# Patient Record
Sex: Female | Born: 1940 | Race: White | Hispanic: No | Marital: Married
Health system: Southern US, Community
[De-identification: ages and names within clinical notes are randomized; demographics above are authoritative.]

## PROBLEM LIST (undated history)

## (undated) DIAGNOSIS — J449 Chronic obstructive pulmonary disease, unspecified: Secondary | ICD-10-CM

## (undated) DIAGNOSIS — I10 Essential (primary) hypertension: Secondary | ICD-10-CM

## (undated) DIAGNOSIS — I639 Cerebral infarction, unspecified: Secondary | ICD-10-CM

## (undated) DIAGNOSIS — S72009A Fracture of unspecified part of neck of unspecified femur, initial encounter for closed fracture: Secondary | ICD-10-CM

## (undated) DIAGNOSIS — I712 Thoracic aortic aneurysm, without rupture, unspecified: Secondary | ICD-10-CM

## (undated) DIAGNOSIS — C50919 Malignant neoplasm of unspecified site of unspecified female breast: Secondary | ICD-10-CM

## (undated) DIAGNOSIS — N182 Chronic kidney disease, stage 2 (mild): Secondary | ICD-10-CM

---

## 2015-03-07 ENCOUNTER — Other Ambulatory Visit (HOSPITAL_COMMUNITY): Payer: Self-pay

## 2015-03-07 ENCOUNTER — Inpatient Hospital Stay
Admission: AD | Admit: 2015-03-07 | Discharge: 2015-04-05 | Disposition: A | Discharge status: Skilled Nursing Facility | Payer: Medicare Other | Source: Ambulatory Visit | Attending: Internal Medicine | Admitting: Internal Medicine

## 2015-03-07 DIAGNOSIS — Z9911 Dependence on respirator [ventilator] status: Secondary | ICD-10-CM

## 2015-03-07 DIAGNOSIS — Z930 Tracheostomy status: Secondary | ICD-10-CM

## 2015-03-07 DIAGNOSIS — J439 Emphysema, unspecified: Secondary | ICD-10-CM

## 2015-03-07 DIAGNOSIS — J9690 Respiratory failure, unspecified, unspecified whether with hypoxia or hypercapnia: Secondary | ICD-10-CM

## 2015-03-07 DIAGNOSIS — Z431 Encounter for attention to gastrostomy: Secondary | ICD-10-CM

## 2015-03-07 DIAGNOSIS — J9621 Acute and chronic respiratory failure with hypoxia: Secondary | ICD-10-CM

## 2015-03-07 HISTORY — DX: Thoracic aortic aneurysm, without rupture, unspecified: I71.20

## 2015-03-07 HISTORY — DX: Thoracic aortic aneurysm, without rupture: I71.2

## 2015-03-07 HISTORY — DX: Essential (primary) hypertension: I10

## 2015-03-07 HISTORY — DX: Malignant neoplasm of unspecified site of unspecified female breast: C50.919

## 2015-03-07 HISTORY — DX: Chronic obstructive pulmonary disease, unspecified: J44.9

## 2015-03-07 HISTORY — DX: Chronic kidney disease, stage 2 (mild): N18.2

## 2015-03-07 HISTORY — DX: Cerebral infarction, unspecified: I63.9

## 2015-03-07 HISTORY — DX: Fracture of unspecified part of neck of unspecified femur, initial encounter for closed fracture: S72.009A

## 2015-03-08 LAB — CBC WITH DIFFERENTIAL/PLATELET
Basophils Absolute: 0 10*3/uL (ref 0.0–0.1)
Basophils Relative: 0 %
EOS ABS: 0.1 10*3/uL (ref 0.0–0.7)
EOS PCT: 2 %
HCT: 29.9 % — ABNORMAL LOW (ref 36.0–46.0)
HEMOGLOBIN: 9.1 g/dL — AB (ref 12.0–15.0)
LYMPHS ABS: 1.2 10*3/uL (ref 0.7–4.0)
Lymphocytes Relative: 16 %
MCH: 27.7 pg (ref 26.0–34.0)
MCHC: 30.4 g/dL (ref 30.0–36.0)
MCV: 91.2 fL (ref 78.0–100.0)
MONOS PCT: 5 %
Monocytes Absolute: 0.4 10*3/uL (ref 0.1–1.0)
NEUTROS PCT: 77 %
Neutro Abs: 5.9 10*3/uL (ref 1.7–7.7)
Platelets: 227 10*3/uL (ref 150–400)
RBC: 3.28 MIL/uL — ABNORMAL LOW (ref 3.87–5.11)
RDW: 15.8 % — ABNORMAL HIGH (ref 11.5–15.5)
WBC: 7.6 10*3/uL (ref 4.0–10.5)

## 2015-03-08 LAB — PHOSPHORUS: PHOSPHORUS: 4.4 mg/dL (ref 2.5–4.6)

## 2015-03-08 LAB — COMPREHENSIVE METABOLIC PANEL
ALK PHOS: 64 U/L (ref 38–126)
ALT: 29 U/L (ref 14–54)
ANION GAP: 10 (ref 5–15)
AST: 26 U/L (ref 15–41)
Albumin: 2.4 g/dL — ABNORMAL LOW (ref 3.5–5.0)
BUN: 28 mg/dL — ABNORMAL HIGH (ref 6–20)
CALCIUM: 9.7 mg/dL (ref 8.9–10.3)
CO2: 25 mmol/L (ref 22–32)
Chloride: 105 mmol/L (ref 101–111)
Creatinine, Ser: 0.39 mg/dL — ABNORMAL LOW (ref 0.44–1.00)
Glucose, Bld: 100 mg/dL — ABNORMAL HIGH (ref 65–99)
Potassium: 3.8 mmol/L (ref 3.5–5.1)
SODIUM: 140 mmol/L (ref 135–145)
TOTAL PROTEIN: 5.9 g/dL — AB (ref 6.5–8.1)
Total Bilirubin: 0.5 mg/dL (ref 0.3–1.2)

## 2015-03-08 LAB — BLOOD GAS, ARTERIAL
Acid-Base Excess: 3.8 mmol/L — ABNORMAL HIGH (ref 0.0–2.0)
Bicarbonate: 27 mEq/L — ABNORMAL HIGH (ref 20.0–24.0)
FIO2: 0.3
MECHVT: 450 mL
O2 Saturation: 97.7 %
PATIENT TEMPERATURE: 98.2
PCO2 ART: 34.8 mmHg — AB (ref 35.0–45.0)
PEEP: 5 cmH2O
PO2 ART: 92.5 mmHg (ref 80.0–100.0)
RATE: 14 resp/min
TCO2: 28.1 mmol/L (ref 0–100)
pH, Arterial: 7.501 — ABNORMAL HIGH (ref 7.350–7.450)

## 2015-03-08 LAB — MAGNESIUM: MAGNESIUM: 2.2 mg/dL (ref 1.7–2.4)

## 2015-03-08 LAB — TSH: TSH: 3.368 u[IU]/mL (ref 0.350–4.500)

## 2015-03-13 LAB — BASIC METABOLIC PANEL
Anion gap: 9 (ref 5–15)
BUN: 34 mg/dL — AB (ref 6–20)
CHLORIDE: 108 mmol/L (ref 101–111)
CO2: 31 mmol/L (ref 22–32)
CREATININE: 0.5 mg/dL (ref 0.44–1.00)
Calcium: 9.3 mg/dL (ref 8.9–10.3)
GFR calc Af Amer: >60 mL/min (ref >60)
GFR calc non Af Amer: >60 mL/min (ref >60)
GLUCOSE: 145 mg/dL — AB (ref 65–99)
Potassium: 3.5 mmol/L (ref 3.5–5.1)
SODIUM: 148 mmol/L — AB (ref 135–145)

## 2015-03-13 LAB — CBC
HCT: 32.6 % — ABNORMAL LOW (ref 36.0–46.0)
HEMOGLOBIN: 9.4 g/dL — AB (ref 12.0–15.0)
MCH: 27 pg (ref 26.0–34.0)
MCHC: 28.8 g/dL — AB (ref 30.0–36.0)
MCV: 93.7 fL (ref 78.0–100.0)
Platelets: 245 10*3/uL (ref 150–400)
RBC: 3.48 MIL/uL — ABNORMAL LOW (ref 3.87–5.11)
RDW: 16.4 % — ABNORMAL HIGH (ref 11.5–15.5)
WBC: 10.2 10*3/uL (ref 4.0–10.5)

## 2015-03-16 LAB — URINALYSIS, ROUTINE W REFLEX MICROSCOPIC
BILIRUBIN URINE: NEGATIVE
Glucose, UA: NEGATIVE mg/dL
HGB URINE DIPSTICK: NEGATIVE
Ketones, ur: NEGATIVE mg/dL
NITRITE: NEGATIVE
PH: 5.5 (ref 5.0–8.0)
Protein, ur: NEGATIVE mg/dL
SPECIFIC GRAVITY, URINE: 1.022 (ref 1.005–1.030)

## 2015-03-16 LAB — URINE MICROSCOPIC-ADD ON

## 2015-03-16 LAB — POTASSIUM: Potassium: 3.6 mmol/L (ref 3.5–5.1)

## 2015-03-17 ENCOUNTER — Other Ambulatory Visit (HOSPITAL_COMMUNITY): Payer: Self-pay

## 2015-03-17 LAB — BASIC METABOLIC PANEL
ANION GAP: 9 (ref 5–15)
BUN: 43 mg/dL — AB (ref 6–20)
CHLORIDE: 115 mmol/L — AB (ref 101–111)
CO2: 34 mmol/L — ABNORMAL HIGH (ref 22–32)
Calcium: 9.2 mg/dL (ref 8.9–10.3)
Creatinine, Ser: 0.58 mg/dL (ref 0.44–1.00)
GFR calc Af Amer: >60 mL/min (ref >60)
GFR calc non Af Amer: >60 mL/min (ref >60)
Glucose, Bld: 138 mg/dL — ABNORMAL HIGH (ref 65–99)
POTASSIUM: 3.4 mmol/L — AB (ref 3.5–5.1)
SODIUM: 158 mmol/L — AB (ref 135–145)

## 2015-03-17 LAB — CBC
HCT: 31.7 % — ABNORMAL LOW (ref 36.0–46.0)
HEMOGLOBIN: 8.8 g/dL — AB (ref 12.0–15.0)
MCH: 27.2 pg (ref 26.0–34.0)
MCHC: 27.8 g/dL — ABNORMAL LOW (ref 30.0–36.0)
MCV: 98.1 fL (ref 78.0–100.0)
Platelets: 197 10*3/uL (ref 150–400)
RBC: 3.23 MIL/uL — AB (ref 3.87–5.11)
RDW: 16.7 % — ABNORMAL HIGH (ref 11.5–15.5)
WBC: 11.4 10*3/uL — AB (ref 4.0–10.5)

## 2015-03-17 LAB — CULTURE, BLOOD (ROUTINE X 2)
CULTURE: NO GROWTH
CULTURE: NO GROWTH

## 2015-03-17 LAB — URINE CULTURE

## 2015-03-18 LAB — BASIC METABOLIC PANEL
Anion gap: 12 (ref 5–15)
BUN: 40 mg/dL — AB (ref 6–20)
CHLORIDE: 114 mmol/L — AB (ref 101–111)
CO2: 30 mmol/L (ref 22–32)
CREATININE: 0.54 mg/dL (ref 0.44–1.00)
Calcium: 9.3 mg/dL (ref 8.9–10.3)
GFR calc Af Amer: >60 mL/min (ref >60)
GFR calc non Af Amer: >60 mL/min (ref >60)
GLUCOSE: 134 mg/dL — AB (ref 65–99)
POTASSIUM: 3.7 mmol/L (ref 3.5–5.1)
SODIUM: 156 mmol/L — AB (ref 135–145)

## 2015-03-21 LAB — BASIC METABOLIC PANEL
Anion gap: 16 — ABNORMAL HIGH (ref 5–15)
BUN: 38 mg/dL — AB (ref 6–20)
CALCIUM: 9.2 mg/dL (ref 8.9–10.3)
CO2: 28 mmol/L (ref 22–32)
CREATININE: 0.59 mg/dL (ref 0.44–1.00)
Chloride: 109 mmol/L (ref 101–111)
GFR calc non Af Amer: >60 mL/min (ref >60)
Glucose, Bld: 133 mg/dL — ABNORMAL HIGH (ref 65–99)
Potassium: 5.8 mmol/L — ABNORMAL HIGH (ref 3.5–5.1)
SODIUM: 153 mmol/L — AB (ref 135–145)

## 2015-03-22 LAB — BASIC METABOLIC PANEL
ANION GAP: 13 (ref 5–15)
BUN: 37 mg/dL — ABNORMAL HIGH (ref 6–20)
CHLORIDE: 109 mmol/L (ref 101–111)
CO2: 28 mmol/L (ref 22–32)
CREATININE: 0.44 mg/dL (ref 0.44–1.00)
Calcium: 9.1 mg/dL (ref 8.9–10.3)
GFR calc non Af Amer: >60 mL/min (ref >60)
GLUCOSE: 154 mg/dL — AB (ref 65–99)
Potassium: 3.7 mmol/L (ref 3.5–5.1)
Sodium: 150 mmol/L — ABNORMAL HIGH (ref 135–145)

## 2015-03-23 ENCOUNTER — Encounter: Payer: Self-pay | Admitting: Adult Health

## 2015-03-23 DIAGNOSIS — Z930 Tracheostomy status: Secondary | ICD-10-CM

## 2015-03-23 DIAGNOSIS — J439 Emphysema, unspecified: Secondary | ICD-10-CM

## 2015-03-23 DIAGNOSIS — J9621 Acute and chronic respiratory failure with hypoxia: Secondary | ICD-10-CM | POA: Diagnosis not present

## 2015-03-23 DIAGNOSIS — Z9911 Dependence on respirator [ventilator] status: Secondary | ICD-10-CM | POA: Diagnosis not present

## 2015-03-23 NOTE — Consult Note (Signed)
   Name: Darlene Owens MRN: [id number] DOB: 03/29/1940    ADMISSION DATE:  03/07/2015 CONSULTATION DATE:  1/12  REFERRING MD :  [facility]   CHIEF COMPLAINT: vent management   BRIEF PATIENT DESCRIPTION:  75yo female with hx HTN, COPD, thoracic aneurysm who underwent elective aneurysm repair in November which was complicated by pulmonary artery injury necessitating an open lower lobectomy and long post operative course with delayed chest closure, prolonged vent, bilateral watershed infarcts, shock, AFib with RVR and UTI.  She underwent trach and PEG placement and was tx 12/27 to [facility] for further vent wean. PCCM consulted for assistance with weaning.    SIGNIFICANT EVENTS    STUDIES:     HISTORY OF PRESENT ILLNESS: 75yo female smoker with hx HTN, COPD, thoracic aneurysm who underwent elective aneurysm repair in November which was complicated by pulmonary artery injury necessitating an open LEFT lower lobectomy and long post operative course with delayed chest closure, prolonged vent, bilateral watershed infarcts, shock, AFib with RVR and UTI.  She underwent trach and PEG placement and was tx 12/27 to [facility] for further vent wean. PCCM consulted for assistance with weaning.    PAST MEDICAL HISTORY :   has a past medical history of Hypertension; Thoracic aortic aneurysm ([facility]); COPD (chronic obstructive pulmonary disease) ([place]); Hip fracture ([place]); CKD (chronic kidney disease), stage II; Breast cancer ([place]); and CVA (cerebral infarction).  has no past surgical history on file. Prior to Admission medications   Not on File   Allergies  Allergen Reactions  . Alphagan [Brimonidine]   . Clindamycin/Lincomycin   . Penicillins     FAMILY HISTORY:  family history is not on file. -- unable to assess.   SOCIAL HISTORY:   current smoker, 1ppd   REVIEW OF SYSTEMS:   Unable.  AMS.  As per HPI obtained from records and select MD.   SUBJECTIVE:   VITAL SIGNS:   Reviewed at bedside,  abnormal values discussed in Imp/plan.  O2 sats 96% on .35 ATC  PHYSICAL EXAMINATION: General:  Thin, chronically ill appearing female, NAD on ATC  Neuro:  Opens eyes to voice, does not follow commands, gen weakness  HEENT:  Mm moist, trach c/d Cardiovascular:  s1s2 rrr Lungs:  resps even non labored on ATC, mild tachypnea, rare scattered wheeze  Abdomen:  Soft, PEG c/d Musculoskeletal:  Warm and dry, 1+ BLE edema    Recent Labs Lab 03/18/15 0641 03/21/15 0617 03/22/15 0630  NA 156* 153* 150*  K 3.7 5.8* 3.7  CL 114* 109 109  CO2 30 28 28   BUN 40* 38* 37*  CREATININE 0.54 0.59 0.44  GLUCOSE 134* 133* 154*    Recent Labs Lab 03/17/15 0906  HGB 8.8*  HCT 31.7*  WBC 11.4*  PLT 197   No results found.  ASSESSMENT / PLAN:  Acute vent dependent respiratory failure -- in setting prolonged complicated hospital course following thoracic surgery with pulmonary artery injury with subsequent LLLobectomy and underlying COPD.  Now s/p trach, tol ATC trials.    PLAN -  ATC per select protocol - will likely be slow wean  BD's  Nutritional support  Mobilize as able  F/u CXR     Nickolas Madrid, NP 03/23/2015  12:16 PM Pager: [phone] or [phone]

## 2015-03-24 LAB — BASIC METABOLIC PANEL
ANION GAP: 12 (ref 5–15)
BUN: 29 mg/dL — AB (ref 6–20)
CALCIUM: 8.7 mg/dL — AB (ref 8.9–10.3)
CO2: 27 mmol/L (ref 22–32)
Chloride: 98 mmol/L — ABNORMAL LOW (ref 101–111)
Creatinine, Ser: 0.38 mg/dL — ABNORMAL LOW (ref 0.44–1.00)
GFR calc Af Amer: >60 mL/min (ref >60)
GLUCOSE: 103 mg/dL — AB (ref 65–99)
POTASSIUM: 5.1 mmol/L (ref 3.5–5.1)
Sodium: 137 mmol/L (ref 135–145)

## 2015-03-26 LAB — BASIC METABOLIC PANEL
ANION GAP: 7 (ref 5–15)
BUN: 23 mg/dL — ABNORMAL HIGH (ref 6–20)
CALCIUM: 9.2 mg/dL (ref 8.9–10.3)
CO2: 30 mmol/L (ref 22–32)
Chloride: 101 mmol/L (ref 101–111)
Creatinine, Ser: 0.35 mg/dL — ABNORMAL LOW (ref 0.44–1.00)
GFR calc non Af Amer: >60 mL/min (ref >60)
Glucose, Bld: 127 mg/dL — ABNORMAL HIGH (ref 65–99)
Potassium: 4 mmol/L (ref 3.5–5.1)
Sodium: 138 mmol/L (ref 135–145)

## 2015-03-27 ENCOUNTER — Other Ambulatory Visit (HOSPITAL_COMMUNITY): Payer: Self-pay

## 2015-03-27 DIAGNOSIS — J9621 Acute and chronic respiratory failure with hypoxia: Secondary | ICD-10-CM | POA: Diagnosis not present

## 2015-03-27 DIAGNOSIS — Z930 Tracheostomy status: Secondary | ICD-10-CM | POA: Diagnosis not present

## 2015-03-27 DIAGNOSIS — J439 Emphysema, unspecified: Secondary | ICD-10-CM | POA: Diagnosis not present

## 2015-03-27 DIAGNOSIS — Z9911 Dependence on respirator [ventilator] status: Secondary | ICD-10-CM | POA: Diagnosis not present

## 2015-03-27 NOTE — Progress Notes (Signed)
   Name: Darlene Owens MRN: [id number] DOB: September 29, 1940    ADMISSION DATE:  03/07/2015 CONSULTATION DATE:  1/12  REFERRING MD :  Select MD   CHIEF COMPLAINT: vent management   BRIEF PATIENT DESCRIPTION:  75yo female with hx HTN, COPD, thoracic aneurysm who underwent elective aneurysm repair in November which was complicated by pulmonary artery injury necessitating an open lower lobectomy and long post operative course with delayed chest closure, prolonged vent, bilateral watershed infarcts, shock, AFib with RVR and UTI.  She underwent trach and PEG placement and was tx 12/27 to [facility] for further vent wean. PCCM consulted for assistance with weaning.    SIGNIFICANT EVENTS    STUDIES:     SUBJECTIVE:  Feels tired  VITAL SIGNS:   Reviewed at bedside, abnormal values discussed in Imp/plan.  HR 92 rr 30s sats 98%  PHYSICAL EXAMINATION: General:  Thin, chronically ill appearing female, NAD on ATC  Neuro:  Opens eyes to voice, does not follow commands, gen weakness, nods head  HEENT:  Mm moist, trach c/d Cardiovascular:  s1s2 rrr Lungs:  resps even a little paradoxical on ATC Abdomen:  Soft, PEG c/d Musculoskeletal:  Warm and dry, 1+ BLE edema    Recent Labs Lab 03/22/15 0630 03/24/15 0733 03/26/15 0550  NA 150* 137 138  K 3.7 5.1 4.0  CL 109 98* 101  CO2 28 27 30   BUN 37* 29* 23*  CREATININE 0.44 0.38* 0.35*  GLUCOSE 154* 103* 127*   No results for input(s): HGB, HCT, WBC, PLT in the last 168 hours. Dg Chest Port 1 View  03/27/2015  CLINICAL DATA:  Respiratory failure. EXAM: PORTABLE CHEST 1 VIEW COMPARISON:  03/17/2015 FINDINGS: The heart size and mediastinal contours are within normal limits. Stable appearance of tracheostomy. Left lower lobe consolidation appears stable with probable associated small left pleural effusion. No pulmonary edema or pneumothorax identified. The visualized skeletal structures are unremarkable. IMPRESSION: Stable left lower lobe  consolidation with probable small left pleural effusion. Electronically Signed   By: Aletta Edouard M.D.   On: 03/27/2015 08:05  no sig change. LLL consolidation; prob element of effusion   ASSESSMENT / PLAN:  Acute vent dependent respiratory failure -- in setting prolonged complicated hospital course following thoracic surgery with pulmonary artery injury with subsequent LLLobectomy and underlying COPD.  Now s/p trach, tol 12 hrs ATC on 1/15, but more fatigued today. Think we will need to go slow with her if we hope to get her off vent completely.     PLAN -  ATC per select protocol - will likely be slow wean  BD's  Nutritional support  Mobilize as able  F/u CXR   Attending Note:  75 year old female with VDRF due to prolonged hospitalization after thoracic surgery with pulmonary artery injury.  On exam lung sounds are coarse bilaterally.  I reviewed CXR myself, trach in good position.  Discussed with RT and PCCM-NP.  VDRF: due to deconditioning and prolonged hospitalization.  - Up to 6 hr TC.  - Recommend PS during the day when not on TC then full vent support at night.  - Keep on the dry side.  Hypoxemia:  - Titrate O2 for sat of 88-92%.  - Keep dry.  Tracheostomy status:  - Maintain cuffed 6 for now.  Patient seen and examined, agree with above note.  I dictated the care and orders written for this patient under my direction.  Rush Farmer, MD [phone]

## 2015-03-30 ENCOUNTER — Other Ambulatory Visit (HOSPITAL_COMMUNITY): Payer: Self-pay

## 2015-03-30 LAB — BASIC METABOLIC PANEL
ANION GAP: 10 (ref 5–15)
BUN: 35 mg/dL — ABNORMAL HIGH (ref 6–20)
CALCIUM: 9.4 mg/dL (ref 8.9–10.3)
CO2: 29 mmol/L (ref 22–32)
CREATININE: 0.42 mg/dL — AB (ref 0.44–1.00)
Chloride: 103 mmol/L (ref 101–111)
GLUCOSE: 134 mg/dL — AB (ref 65–99)
Potassium: 3.8 mmol/L (ref 3.5–5.1)
Sodium: 142 mmol/L (ref 135–145)

## 2015-03-30 LAB — CBC
HEMATOCRIT: 30.2 % — AB (ref 36.0–46.0)
Hemoglobin: 8.8 g/dL — ABNORMAL LOW (ref 12.0–15.0)
MCH: 26.7 pg (ref 26.0–34.0)
MCHC: 29.1 g/dL — AB (ref 30.0–36.0)
MCV: 91.5 fL (ref 78.0–100.0)
PLATELETS: 285 10*3/uL (ref 150–400)
RBC: 3.3 MIL/uL — ABNORMAL LOW (ref 3.87–5.11)
RDW: 17.8 % — AB (ref 11.5–15.5)
WBC: 8.8 10*3/uL (ref 4.0–10.5)

## 2015-04-01 LAB — BLOOD GAS, ARTERIAL
Acid-Base Excess: 5.5 mmol/L — ABNORMAL HIGH (ref 0.0–2.0)
Bicarbonate: 28.9 mEq/L — ABNORMAL HIGH (ref 20.0–24.0)
FIO2: 0.28
MECHVT: 450 mL
O2 Saturation: 97.6 %
PATIENT TEMPERATURE: 98
PEEP/CPAP: 5 cmH2O
PO2 ART: 92.6 mmHg (ref 80.0–100.0)
RATE: 14 resp/min
TCO2: 30 mmol/L (ref 0–100)
pCO2 arterial: 36.5 mmHg (ref 35.0–45.0)
pH, Arterial: 7.507 — ABNORMAL HIGH (ref 7.350–7.450)

## 2015-04-01 LAB — CBC
HCT: 32.1 % — ABNORMAL LOW (ref 36.0–46.0)
HEMOGLOBIN: 9.1 g/dL — AB (ref 12.0–15.0)
MCH: 26.6 pg (ref 26.0–34.0)
MCHC: 28.3 g/dL — AB (ref 30.0–36.0)
MCV: 93.9 fL (ref 78.0–100.0)
PLATELETS: 299 10*3/uL (ref 150–400)
RBC: 3.42 MIL/uL — AB (ref 3.87–5.11)
RDW: 18.2 % — AB (ref 11.5–15.5)
WBC: 13.8 10*3/uL — AB (ref 4.0–10.5)

## 2015-04-01 LAB — BASIC METABOLIC PANEL
Anion gap: 11 (ref 5–15)
BUN: 38 mg/dL — AB (ref 6–20)
CALCIUM: 9.2 mg/dL (ref 8.9–10.3)
CO2: 29 mmol/L (ref 22–32)
CREATININE: 0.41 mg/dL — AB (ref 0.44–1.00)
Chloride: 108 mmol/L (ref 101–111)
GFR calc Af Amer: >60 mL/min (ref >60)
GLUCOSE: 144 mg/dL — AB (ref 65–99)
Potassium: 3.7 mmol/L (ref 3.5–5.1)
SODIUM: 148 mmol/L — AB (ref 135–145)

## 2015-04-01 LAB — C-REACTIVE PROTEIN: CRP: 11.9 mg/dL — AB (ref <1.0)

## 2015-04-02 ENCOUNTER — Other Ambulatory Visit (HOSPITAL_COMMUNITY): Payer: Self-pay

## 2015-04-02 LAB — URINALYSIS, ROUTINE W REFLEX MICROSCOPIC
BILIRUBIN URINE: NEGATIVE
GLUCOSE, UA: NEGATIVE mg/dL
Ketones, ur: NEGATIVE mg/dL
Nitrite: NEGATIVE
PROTEIN: NEGATIVE mg/dL
SPECIFIC GRAVITY, URINE: 1.022 (ref 1.005–1.030)
pH: 6 (ref 5.0–8.0)

## 2015-04-02 LAB — CBC
HEMATOCRIT: 28.2 % — AB (ref 36.0–46.0)
HEMOGLOBIN: 8 g/dL — AB (ref 12.0–15.0)
MCH: 26.5 pg (ref 26.0–34.0)
MCHC: 28.4 g/dL — AB (ref 30.0–36.0)
MCV: 93.4 fL (ref 78.0–100.0)
Platelets: 247 10*3/uL (ref 150–400)
RBC: 3.02 MIL/uL — AB (ref 3.87–5.11)
RDW: 18.4 % — ABNORMAL HIGH (ref 11.5–15.5)
WBC: 11.5 10*3/uL — AB (ref 4.0–10.5)

## 2015-04-02 LAB — BASIC METABOLIC PANEL
Anion gap: 9 (ref 5–15)
BUN: 30 mg/dL — AB (ref 6–20)
CALCIUM: 8.8 mg/dL — AB (ref 8.9–10.3)
CHLORIDE: 112 mmol/L — AB (ref 101–111)
CO2: 29 mmol/L (ref 22–32)
CREATININE: 0.41 mg/dL — AB (ref 0.44–1.00)
GFR calc non Af Amer: >60 mL/min (ref >60)
Glucose, Bld: 139 mg/dL — ABNORMAL HIGH (ref 65–99)
Potassium: 3 mmol/L — ABNORMAL LOW (ref 3.5–5.1)
SODIUM: 150 mmol/L — AB (ref 135–145)

## 2015-04-02 LAB — URINE MICROSCOPIC-ADD ON

## 2015-04-02 LAB — MAGNESIUM: MAGNESIUM: 2 mg/dL (ref 1.7–2.4)

## 2015-04-03 DIAGNOSIS — J9621 Acute and chronic respiratory failure with hypoxia: Secondary | ICD-10-CM | POA: Diagnosis not present

## 2015-04-03 DIAGNOSIS — J439 Emphysema, unspecified: Secondary | ICD-10-CM | POA: Diagnosis not present

## 2015-04-03 DIAGNOSIS — Z930 Tracheostomy status: Secondary | ICD-10-CM | POA: Diagnosis not present

## 2015-04-03 LAB — BASIC METABOLIC PANEL
ANION GAP: 11 (ref 5–15)
BUN: 26 mg/dL — ABNORMAL HIGH (ref 6–20)
CALCIUM: 9 mg/dL (ref 8.9–10.3)
CO2: 28 mmol/L (ref 22–32)
Chloride: 110 mmol/L (ref 101–111)
Creatinine, Ser: <0.3 mg/dL — ABNORMAL LOW (ref 0.44–1.00)
Glucose, Bld: 129 mg/dL — ABNORMAL HIGH (ref 65–99)
Potassium: 3.4 mmol/L — ABNORMAL LOW (ref 3.5–5.1)
Sodium: 149 mmol/L — ABNORMAL HIGH (ref 135–145)

## 2015-04-03 NOTE — Progress Notes (Signed)
Name: Darlene Owens MRN: [id number] DOB: 07/22/1940    ADMISSION DATE:  03/07/2015 CONSULTATION DATE:  1/12  REFERRING MD :  Select MD   CHIEF COMPLAINT: vent management   BRIEF PATIENT DESCRIPTION:  75yo female with hx HTN, COPD, thoracic aneurysm who underwent elective aneurysm repair in November which was complicated by pulmonary artery injury necessitating an open lower lobectomy and long post operative course with delayed chest closure, prolonged vent, bilateral watershed infarcts, shock, AFib with RVR and UTI.  She underwent trach and PEG placement and was tx 12/27 to select for further vent wean. PCCM consulted for assistance with weaning.    SIGNIFICANT EVENTS  12/27  To [facility] for vent wean  STUDIES:     SUBJECTIVE: Up to chair, PT putting back to bed.  No acute events.  On 28% ATC.  Staff report intermittent weaning, tolerating up to 16 hours per day on ATC then will require Vent support.     VITAL SIGNS: Reviewed at bedside, abnormal values discussed in Imp/plan.    PHYSICAL EXAMINATION: General:  Thin, chronically ill appearing female, NAD on ATC  Neuro:  Opens eyes to voice, gen weakness, nods head  HEENT:  Mm moist, trach c/d/i Cardiovascular:  s1s2 rrr Lungs:  resps even/non-labored on ATC, diminished on L, clear on R Abdomen:  Soft, PEG c/d Musculoskeletal:  Warm and dry, 1+ BLE edema    Recent Labs Lab 04/01/15 1625 04/02/15 0925 04/03/15 0812  NA 148* 150* 149*  K 3.7 3.0* 3.4*  CL 108 112* 110  CO2 29 29 28   BUN 38* 30* 26*  CREATININE 0.41* 0.41* <0.30*  GLUCOSE 144* 139* 129*    Recent Labs Lab 03/30/15 0445 04/01/15 1625 04/02/15 0925  HGB 8.8* 9.1* 8.0*  HCT 30.2* 32.1* 28.2*  WBC 8.8 13.8* 11.5*  PLT 285 299 247   Dg Chest Port 1 View  04/02/2015  CLINICAL DATA:  Respiratory failure EXAM: PORTABLE CHEST 1 VIEW COMPARISON:  03/30/2015 FINDINGS: Cardiomediastinal silhouette is stable. Status post median sternotomy. Mild  interstitial prominence bilaterally again noted without convincing pulmonary edema. Persistent streaky airspace disease in left lower lobe. Tracheostomy tube is unchanged in position. Right lung is clear. IMPRESSION: Status post median sternotomy. Mild interstitial prominence bilaterally again noted without convincing pulmonary edema. Persistent streaky airspace disease in left lower lobe. Electronically Signed   By: Lahoma Crocker M.D.   On: 04/02/2015 10:08    ASSESSMENT / PLAN:  Acute vent dependent respiratory failure - in setting prolonged complicated hospital course following thoracic surgery with pulmonary artery injury with subsequent LLLobectomy and underlying COPD.  S/p trach, tolerating up to 16 hours ATC intermittently but then will have periods of fatigue.  Think we will need to go slow with her if we hope to get her off vent completely.     Hypoxemia   Tracheostomy Status   PLAN -  ATC per select protocol - will likely be slow wean Anticipate she will need nocturnal support with vent weaning during the day for now. Hopeful she will wean completely from vent eventually.   BD's  Nutritional support  Mobilize as able  Intermittent CXR  Maintain #6 cuffed for now with vent needs    Noe Gens, NP-C [facility] Pgr: [phone] or if no answer [phone] 04/03/2015, 12:07 PM   Attending Note:  I have examined patient, reviewed labs, studies and notes. I have discussed the case with B Ollis, and I agree  with the data and plans as amended above.   Baltazar Apo, MD, PhD 04/03/2015, 3:02 PM [facility] [phone] or if no answer [phone]

## 2015-04-04 LAB — CBC
HCT: 31.5 % — ABNORMAL LOW (ref 36.0–46.0)
HEMOGLOBIN: 9.2 g/dL — AB (ref 12.0–15.0)
MCH: 27.4 pg (ref 26.0–34.0)
MCHC: 29.2 g/dL — ABNORMAL LOW (ref 30.0–36.0)
MCV: 93.8 fL (ref 78.0–100.0)
Platelets: 209 10*3/uL (ref 150–400)
RBC: 3.36 MIL/uL — AB (ref 3.87–5.11)
RDW: 18.2 % — ABNORMAL HIGH (ref 11.5–15.5)
WBC: 11.6 10*3/uL — AB (ref 4.0–10.5)

## 2015-04-04 LAB — BASIC METABOLIC PANEL
ANION GAP: 9 (ref 5–15)
BUN: 20 mg/dL (ref 6–20)
CO2: 26 mmol/L (ref 22–32)
Calcium: 9.2 mg/dL (ref 8.9–10.3)
Chloride: 113 mmol/L — ABNORMAL HIGH (ref 101–111)
Creatinine, Ser: 0.33 mg/dL — ABNORMAL LOW (ref 0.44–1.00)
GFR calc Af Amer: >60 mL/min (ref >60)
GFR calc non Af Amer: >60 mL/min (ref >60)
Glucose, Bld: 121 mg/dL — ABNORMAL HIGH (ref 65–99)
POTASSIUM: 3.4 mmol/L — AB (ref 3.5–5.1)
SODIUM: 148 mmol/L — AB (ref 135–145)

## 2015-04-04 LAB — URINE CULTURE: Culture: 40000

## 2015-04-05 LAB — BASIC METABOLIC PANEL
ANION GAP: 11 (ref 5–15)
BUN: 21 mg/dL — ABNORMAL HIGH (ref 6–20)
CO2: 24 mmol/L (ref 22–32)
Calcium: 9.1 mg/dL (ref 8.9–10.3)
Chloride: 111 mmol/L (ref 101–111)
Creatinine, Ser: <0.3 mg/dL — ABNORMAL LOW (ref 0.44–1.00)
GLUCOSE: 119 mg/dL — AB (ref 65–99)
POTASSIUM: 3.3 mmol/L — AB (ref 3.5–5.1)
Sodium: 146 mmol/L — ABNORMAL HIGH (ref 135–145)

## 2015-04-06 LAB — CULTURE, BLOOD (ROUTINE X 2)
CULTURE: NO GROWTH
Culture: NO GROWTH

## 2015-10-10 DEATH — deceased

## 2017-09-14 IMAGING — CR DG ABD PORTABLE 1V
1 series · 1 of 1 positions shown · non-contrast
Comparison: None.

CLINICAL DATA: Acute superimposed upon chronic respiratory failure.
Evaluate positioning of the indwelling gastrostomy tube.

EXAM:
PORTABLE ABDOMEN - 1 VIEW

[AP]
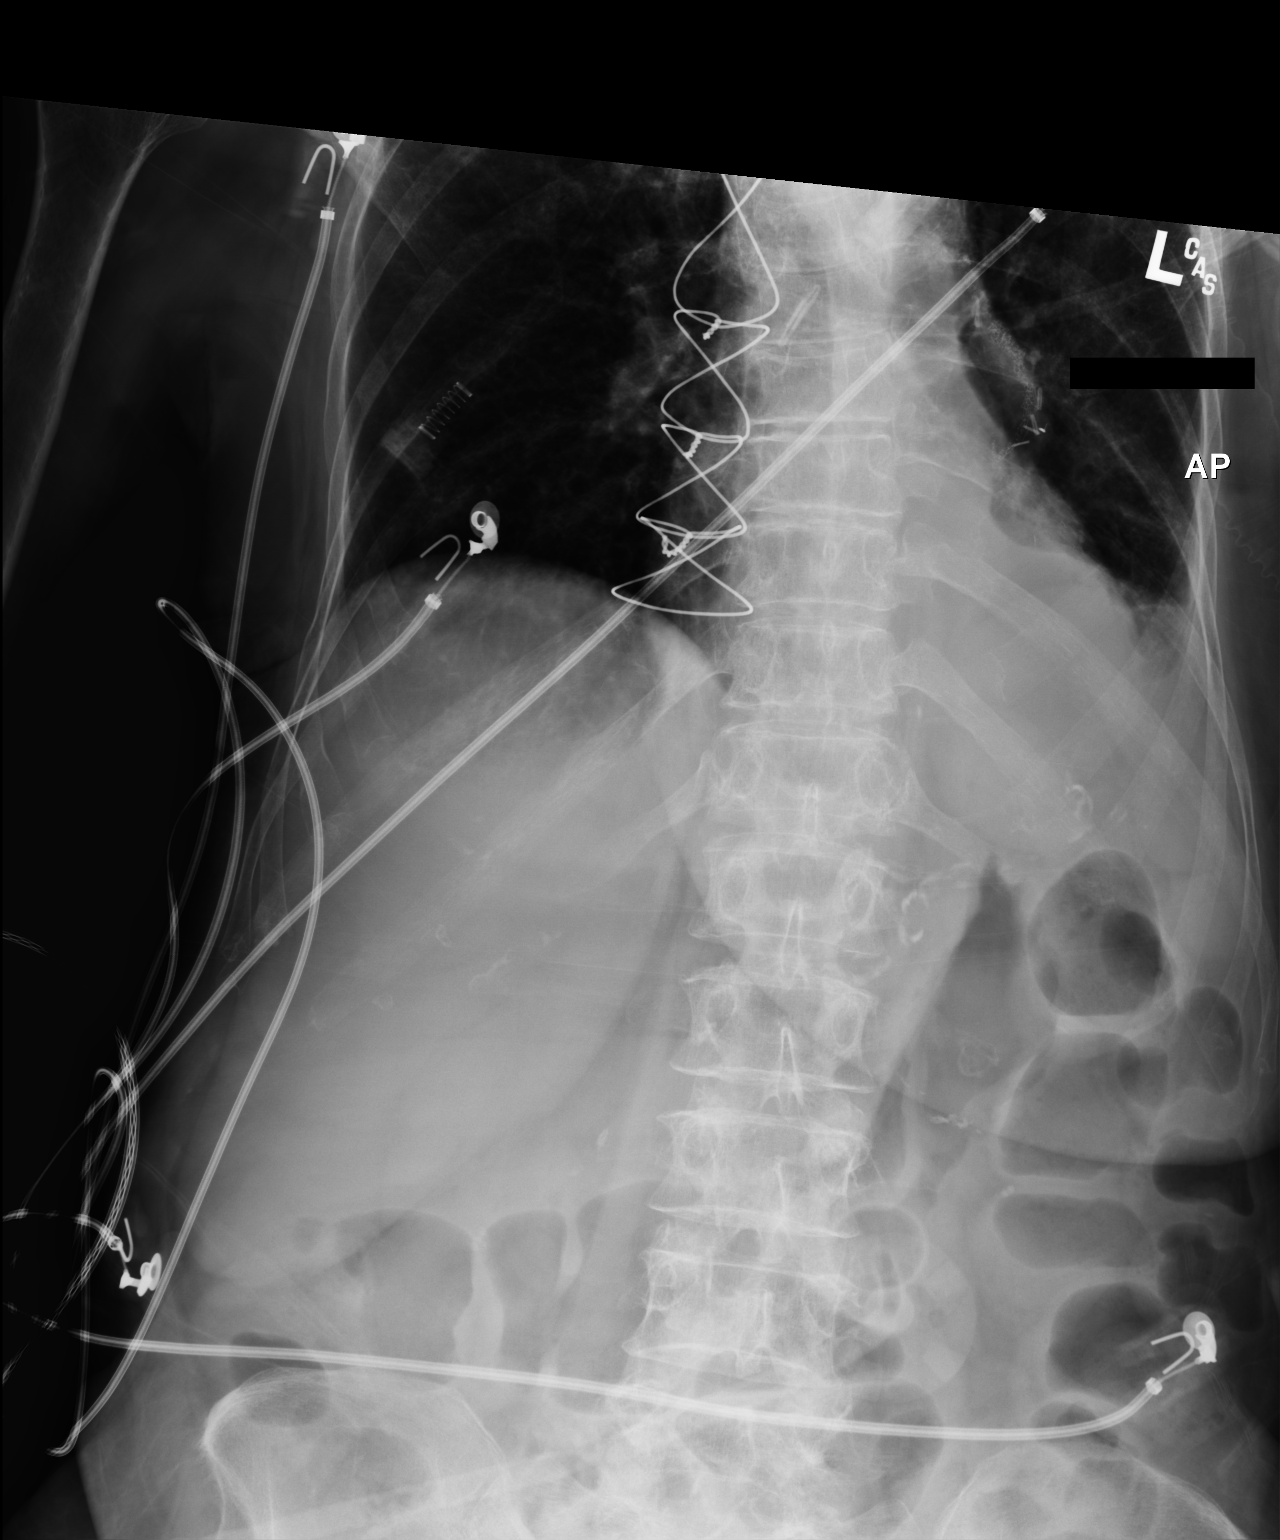

[1 of 1 positions shown; findings below may reference images not displayed]

FINDINGS: Gastrostomy tube overlies the distal body of the stomach. Visualized
upper abdominal bowel gas pattern unremarkable.
IMPRESSION: Gastrostomy tube overlies the distal body of the stomach. No acute
abdominal abnormality.
